# Patient Record
Sex: Female | Born: 1955 | Race: White | Hispanic: No | Marital: Single | Smoking: Current every day smoker
Health system: Southern US, Community
[De-identification: ages and names within clinical notes are randomized; demographics above are authoritative.]

---

## 2013-02-26 ENCOUNTER — Emergency Department (HOSPITAL_COMMUNITY): Payer: Medicaid Other

## 2013-02-26 ENCOUNTER — Inpatient Hospital Stay (HOSPITAL_COMMUNITY)
Admission: EM | Admit: 2013-02-26 | Discharge: 2013-03-05 | DRG: 064 | Disposition: E | Payer: Medicaid Other | Attending: Emergency Medicine | Admitting: Emergency Medicine

## 2013-02-26 ENCOUNTER — Encounter (HOSPITAL_COMMUNITY): Payer: Self-pay | Admitting: Emergency Medicine

## 2013-02-26 ENCOUNTER — Encounter (HOSPITAL_COMMUNITY)
Admission: AD | Admit: 2013-02-26 | Discharge: 2013-02-26 | Disposition: A | Discharge status: Home / Self Care | Payer: Medicaid Other | Source: Ambulatory Visit | Attending: Emergency Medicine | Admitting: Emergency Medicine

## 2013-02-26 ENCOUNTER — Other Ambulatory Visit: Payer: Self-pay

## 2013-02-26 DIAGNOSIS — J9600 Acute respiratory failure, unspecified whether with hypoxia or hypercapnia: Secondary | ICD-10-CM | POA: Diagnosis present

## 2013-02-26 DIAGNOSIS — Z66 Do not resuscitate: Secondary | ICD-10-CM | POA: Diagnosis present

## 2013-02-26 DIAGNOSIS — I10 Essential (primary) hypertension: Secondary | ICD-10-CM | POA: Diagnosis present

## 2013-02-26 DIAGNOSIS — I959 Hypotension, unspecified: Secondary | ICD-10-CM | POA: Diagnosis present

## 2013-02-26 DIAGNOSIS — G936 Cerebral edema: Secondary | ICD-10-CM | POA: Diagnosis present

## 2013-02-26 DIAGNOSIS — G9382 Brain death: Secondary | ICD-10-CM | POA: Diagnosis not present

## 2013-02-26 DIAGNOSIS — R4182 Altered mental status, unspecified: Secondary | ICD-10-CM | POA: Diagnosis present

## 2013-02-26 DIAGNOSIS — F17200 Nicotine dependence, unspecified, uncomplicated: Secondary | ICD-10-CM | POA: Diagnosis present

## 2013-02-26 DIAGNOSIS — R7309 Other abnormal glucose: Secondary | ICD-10-CM | POA: Diagnosis present

## 2013-02-26 DIAGNOSIS — J9690 Respiratory failure, unspecified, unspecified whether with hypoxia or hypercapnia: Secondary | ICD-10-CM

## 2013-02-26 DIAGNOSIS — G911 Obstructive hydrocephalus: Secondary | ICD-10-CM | POA: Diagnosis present

## 2013-02-26 DIAGNOSIS — D72829 Elevated white blood cell count, unspecified: Secondary | ICD-10-CM | POA: Diagnosis present

## 2013-02-26 DIAGNOSIS — I619 Nontraumatic intracerebral hemorrhage, unspecified: Secondary | ICD-10-CM | POA: Diagnosis present

## 2013-02-26 DIAGNOSIS — G9340 Encephalopathy, unspecified: Secondary | ICD-10-CM | POA: Diagnosis present

## 2013-02-26 DIAGNOSIS — Z823 Family history of stroke: Secondary | ICD-10-CM | POA: Diagnosis not present

## 2013-02-26 DIAGNOSIS — Z515 Encounter for palliative care: Secondary | ICD-10-CM

## 2013-02-26 DIAGNOSIS — R4020 Unspecified coma: Secondary | ICD-10-CM

## 2013-02-26 DIAGNOSIS — I629 Nontraumatic intracranial hemorrhage, unspecified: Secondary | ICD-10-CM

## 2013-02-26 DIAGNOSIS — Z529 Donor of unspecified organ or tissue: Secondary | ICD-10-CM | POA: Insufficient documentation

## 2013-02-26 LAB — CBC WITH DIFFERENTIAL/PLATELET
BASOS ABS: 0.2 10*3/uL — AB (ref 0.0–0.1)
Basophils Relative: 1 % (ref 0–1)
EOS ABS: 0.2 10*3/uL (ref 0.0–0.7)
Eosinophils Relative: 1 % (ref 0–5)
HEMATOCRIT: 46.2 % — AB (ref 36.0–46.0)
Hemoglobin: 16.1 g/dL — ABNORMAL HIGH (ref 12.0–15.0)
LYMPHS PCT: 31 % (ref 12–46)
Lymphs Abs: 6.4 10*3/uL — ABNORMAL HIGH (ref 0.7–4.0)
MCH: 30.9 pg (ref 26.0–34.0)
MCHC: 34.8 g/dL (ref 30.0–36.0)
MCV: 88.7 fL (ref 78.0–100.0)
Monocytes Absolute: 1.2 10*3/uL — ABNORMAL HIGH (ref 0.1–1.0)
Monocytes Relative: 6 % (ref 3–12)
Neutro Abs: 12.8 10*3/uL — ABNORMAL HIGH (ref 1.7–7.7)
Neutrophils Relative %: 61 % (ref 43–77)
PLATELETS: 325 10*3/uL (ref 150–400)
RBC: 5.21 MIL/uL — ABNORMAL HIGH (ref 3.87–5.11)
RDW: 12.4 % (ref 11.5–15.5)
WBC: 20.8 10*3/uL — AB (ref 4.0–10.5)

## 2013-02-26 LAB — URINALYSIS, ROUTINE W REFLEX MICROSCOPIC
Bilirubin Urine: NEGATIVE
Bilirubin Urine: NEGATIVE
GLUCOSE, UA: NEGATIVE mg/dL
Glucose, UA: 100 mg/dL — AB
Hgb urine dipstick: NEGATIVE
KETONES UR: NEGATIVE mg/dL
Ketones, ur: NEGATIVE mg/dL
LEUKOCYTES UA: NEGATIVE
Leukocytes, UA: NEGATIVE
NITRITE: NEGATIVE
Nitrite: NEGATIVE
PH: 6.5 (ref 5.0–8.0)
PH: 7.5 (ref 5.0–8.0)
Protein, ur: 100 mg/dL — AB
Protein, ur: NEGATIVE mg/dL
SPECIFIC GRAVITY, URINE: 1.025 (ref 1.005–1.030)
Specific Gravity, Urine: 1.009 (ref 1.005–1.030)
Urobilinogen, UA: 0.2 mg/dL (ref 0.0–1.0)
Urobilinogen, UA: 0.2 mg/dL (ref 0.0–1.0)

## 2013-02-26 LAB — COMPREHENSIVE METABOLIC PANEL
ALBUMIN: 3.5 g/dL (ref 3.5–5.2)
ALK PHOS: 95 U/L (ref 39–117)
ALT: 13 U/L (ref 0–35)
AST: 25 U/L (ref 0–37)
BUN: 10 mg/dL (ref 6–23)
CO2: 21 mEq/L (ref 19–32)
Calcium: 8.6 mg/dL (ref 8.4–10.5)
Chloride: 97 mEq/L (ref 96–112)
Creatinine, Ser: 0.87 mg/dL (ref 0.50–1.10)
GFR calc Af Amer: 84 mL/min — ABNORMAL LOW (ref >90)
GFR calc non Af Amer: 73 mL/min — ABNORMAL LOW (ref >90)
Glucose, Bld: 209 mg/dL — ABNORMAL HIGH (ref 70–99)
POTASSIUM: 4.6 meq/L (ref 3.7–5.3)
Sodium: 137 mEq/L (ref 137–147)
Total Bilirubin: <0.2 mg/dL — ABNORMAL LOW (ref 0.3–1.2)
Total Protein: 7.6 g/dL (ref 6.0–8.3)

## 2013-02-26 LAB — CG4 I-STAT (LACTIC ACID): LACTIC ACID, VENOUS: 5.78 mmol/L — AB (ref 0.5–2.2)

## 2013-02-26 LAB — PROTIME-INR
INR: 1 (ref 0.00–1.49)
Prothrombin Time: 13 seconds (ref 11.6–15.2)

## 2013-02-26 LAB — BASIC METABOLIC PANEL
BUN: 10 mg/dL (ref 6–23)
CHLORIDE: 100 meq/L (ref 96–112)
CO2: 26 meq/L (ref 19–32)
CREATININE: 0.97 mg/dL (ref 0.50–1.10)
Calcium: 8.9 mg/dL (ref 8.4–10.5)
GFR calc non Af Amer: 64 mL/min — ABNORMAL LOW (ref >90)
GFR, EST AFRICAN AMERICAN: 74 mL/min — AB (ref >90)
Glucose, Bld: 185 mg/dL — ABNORMAL HIGH (ref 70–99)
POTASSIUM: 3.7 meq/L (ref 3.7–5.3)
Sodium: 141 mEq/L (ref 137–147)

## 2013-02-26 LAB — URINE MICROSCOPIC-ADD ON

## 2013-02-26 LAB — POCT I-STAT 3, ART BLOOD GAS (G3+)
Acid-base deficit: 3 mmol/L — ABNORMAL HIGH (ref 0.0–2.0)
BICARBONATE: 24.4 meq/L — AB (ref 20.0–24.0)
O2 Saturation: 96 %
PCO2 ART: 53 mmHg — AB (ref 35.0–45.0)
PH ART: 7.271 — AB (ref 7.350–7.450)
Patient temperature: 98.6
TCO2: 26 mmol/L (ref 0–100)
pO2, Arterial: 95 mmHg (ref 80.0–100.0)

## 2013-02-26 LAB — GLUCOSE, CAPILLARY
Glucose-Capillary: 146 mg/dL — ABNORMAL HIGH (ref 70–99)
Glucose-Capillary: 164 mg/dL — ABNORMAL HIGH (ref 70–99)

## 2013-02-26 LAB — ABO/RH: ABO/RH(D): O POS

## 2013-02-26 LAB — RAPID URINE DRUG SCREEN, HOSP PERFORMED
Amphetamines: NOT DETECTED
BARBITURATES: NOT DETECTED
Benzodiazepines: NOT DETECTED
Cocaine: NOT DETECTED
Opiates: NOT DETECTED
Tetrahydrocannabinol: NOT DETECTED

## 2013-02-26 LAB — POCT I-STAT TROPONIN I: Troponin i, poc: 0.02 ng/mL (ref 0.00–0.08)

## 2013-02-26 LAB — PRO B NATRIURETIC PEPTIDE: PRO B NATRI PEPTIDE: 445.5 pg/mL — AB (ref 0–125)

## 2013-02-26 LAB — APTT: aPTT: 26 seconds (ref 24–37)

## 2013-02-26 LAB — ETHANOL: Alcohol, Ethyl (B): <11 mg/dL (ref 0–11)

## 2013-02-26 LAB — MRSA PCR SCREENING: MRSA BY PCR: NEGATIVE

## 2013-02-26 MED ORDER — SODIUM CHLORIDE 0.9 % IV SOLN
INTRAVENOUS | Status: DC
  Administered 2013-02-26: 14:00:00 via INTRAVENOUS

## 2013-02-26 MED ORDER — PROPOFOL 10 MG/ML IV EMUL: 5.0000 ug/kg/min | INTRAVENOUS | Status: DC

## 2013-02-26 MED ORDER — LIDOCAINE HCL (CARDIAC) 20 MG/ML IV SOLN
INTRAVENOUS | Status: AC
  Filled 2013-02-26: qty 5

## 2013-02-26 MED ORDER — ETOMIDATE 2 MG/ML IV SOLN
INTRAVENOUS | Status: AC
  Filled 2013-02-26: qty 20

## 2013-02-26 MED ORDER — PROPOFOL 10 MG/ML IV EMUL
5.0000 ug/kg/min | INTRAVENOUS | Status: DC
  Administered 2013-02-26: 10 ug/kg/min via INTRAVENOUS

## 2013-02-26 MED ORDER — PROPOFOL 10 MG/ML IV EMUL
INTRAVENOUS | Status: AC
  Administered 2013-02-26: 10 ug/kg/min via INTRAVENOUS
  Filled 2013-02-26: qty 100

## 2013-02-26 MED ORDER — PHENYLEPHRINE HCL 10 MG/ML IJ SOLN
30.0000 ug/min | INTRAVENOUS | Status: DC
  Administered 2013-02-26: 150 ug/min via INTRAVENOUS
  Administered 2013-02-27: 50 ug/min via INTRAVENOUS
  Filled 2013-02-26 (×2): qty 4

## 2013-02-26 MED ORDER — SUCCINYLCHOLINE CHLORIDE 20 MG/ML IJ SOLN
INTRAMUSCULAR | Status: AC
  Filled 2013-02-26: qty 1

## 2013-02-26 MED ORDER — NICARDIPINE HCL IN NACL 20-0.86 MG/200ML-% IV SOLN
3.0000 mg/h | INTRAVENOUS | Status: DC
  Administered 2013-02-26: 15 mg/h via INTRAVENOUS
  Filled 2013-02-26 (×2): qty 200

## 2013-02-26 MED ORDER — SODIUM CHLORIDE 0.9 % IV SOLN
INTRAVENOUS | Status: DC
  Administered 2013-02-26: 16:00:00 via INTRAVENOUS

## 2013-02-26 MED ORDER — SODIUM CHLORIDE 0.9 % IV BOLUS (SEPSIS)
2000.0000 mL | Freq: Once | INTRAVENOUS | Status: AC
  Administered 2013-02-26: 2000 mL via INTRAVENOUS

## 2013-02-26 MED ORDER — INSULIN ASPART 100 UNIT/ML ~~LOC~~ SOLN: 2.0000 [IU] | SUBCUTANEOUS | Status: DC

## 2013-02-26 MED ORDER — IOHEXOL 350 MG/ML SOLN
70.0000 mL | Freq: Once | INTRAVENOUS | Status: AC | PRN
  Administered 2013-02-26: 70 mL via INTRAVENOUS

## 2013-02-26 MED ORDER — SUCCINYLCHOLINE CHLORIDE 20 MG/ML IJ SOLN
INTRAMUSCULAR | Status: DC | PRN
  Administered 2013-02-26: 100 mg via INTRAVENOUS

## 2013-02-26 MED ORDER — HYDRALAZINE HCL 20 MG/ML IJ SOLN: 5.0000 mg | INTRAMUSCULAR | Status: DC | PRN

## 2013-02-26 MED ORDER — ETOMIDATE 2 MG/ML IV SOLN
INTRAVENOUS | Status: DC | PRN
  Administered 2013-02-26: 30 mg via INTRAVENOUS

## 2013-02-26 MED ORDER — PHENYLEPHRINE HCL 10 MG/ML IJ SOLN
30.0000 ug/min | INTRAVENOUS | Status: DC
  Administered 2013-02-26: 30 ug/min via INTRAVENOUS
  Filled 2013-02-26: qty 1

## 2013-02-26 MED ORDER — ROCURONIUM BROMIDE 50 MG/5ML IV SOLN
INTRAVENOUS | Status: AC
  Filled 2013-02-26: qty 2

## 2013-02-26 NOTE — ED Notes (Signed)
PCCM MD aware of continued elevated BP. Diprivan gtt started per order

## 2013-02-26 NOTE — Progress Notes (Signed)
3 separate RT's attempted art line insertion with no ability to thread cath.  No issues, or noted distress.

## 2013-02-26 NOTE — ED Notes (Signed)
PCCM MD at bedside. Diprivan gtt stopped per order

## 2013-02-26 NOTE — H&P (Signed)
Name: Emma FlockBetsi Pearson MRN: [id number] DOB: August 22, 1955    ADMISSION DATE:  02/24/2013 CONSULTATION DATE:  02/04/2013  REFERRING MD : Gwyneth SproutWhitney Plunkett, MD PRIMARY SERVICE: [facility]  CHIEF COMPLAINT:  Altered mental Status, Fall  BRIEF PATIENT DESCRIPTION: 58 Y O Female, No Hx per our Records and none from family, presented with Altered mental status and unresponsiveness. Was manually ventilated by EMS, Was Intubated in the ED.   SIGNIFICANT EVENTS / STUDIES:  - ETT- 02/23/2013 >>>   LINES / TUBES: Foley- 02/11/2013  CULTURES:   ANTIBIOTICS: None  HISTORY OF PRESENT ILLNESS: 58 Y O F with no PMH, as patient does not believe in going to doctors. Patient was in her usual state of health until this morning when she was doing her normal chores outside when she suddenly called out for her mum, telling her to call the police, and that "her body just went numb". By the time her mum came to see her she was on the floor, unresponsive. No jerking of her extremities, urinary or fecal incontinence, no facial assym. No prior compliants of headache, change in vision , chest pain or palpitations, no difficulty breathing, Family reports good appetite, no vomiting or diarrhea, no fever. Unknown if pt has any chronic medical conditions. Patient smokes 1 pack of cigarette/day and has for the past ~20 years. Does not take alcohol or take any medication, except the occasional tylenol.  PAST MEDICAL HISTORY :  History reviewed. No pertinent past medical history. History reviewed. No pertinent past surgical history. Prior to Admission medications   Not on File   No Known Allergies  FAMILY HISTORY:  Patients brother died of a hemorrhagic stroke about a year ago.  SOCIAL HISTORY:  reports that she has been smoking.  She does not have any smokeless tobacco history on file. She reports that she does not drink alcohol or use illicit drugs.  REVIEW OF SYSTEMS:  As per HPI.  SUBJECTIVE:   VITAL  SIGNS: Temp:  [98 F (36.7 C)-98.4 F (36.9 C)] 98.4 F (36.9 C) (01/25 1404) Pulse Rate:  [64-115] 66 (01/25 1430) Resp:  [16-18] 16 (01/25 1430) BP: (145-225)/(71-95) 187/90 mmHg (01/25 1430) SpO2:  [97 %-100 %] 100 % (01/25 1430) FiO2 (%):  [40 %] 40 % (01/25 1430) Weight:  [185 lb (83.915 kg)] 185 lb (83.915 kg) (01/25 1303) HEMODYNAMICS:   VENTILATOR SETTINGS: Vent Mode:  [-] PRVC FiO2 (%):  [40 %] 40 % Set Rate:  [16 bmp] 16 bmp Vt Set:  [500 mL] 500 mL PEEP:  [5 cmH20] 5 cmH20 Plateau Pressure:  [19 cmH20] 19 cmH20 INTAKE / OUTPUT: Intake/Output   None     PHYSICAL EXAMINATION: General:  Unresponsive, had gotten some paralytics for intubation, off sedatives, OTT in place Neuro:  Unresponsive. HEENT:  Pupils- dilated equally, not responsive to light bilat. Cardiovascular:  S1, S2, no murmurs, rubs or gallops. Lungs:  Coarse Breath sounds bilat, on Vent, equal vols of air bilat. Abdomen: Soft, bowel sounds reduced. Musculoskeletal:  No pedal edema. Skin: Warm , dry, No rash.  LABS:  CBC  Recent Labs Lab 02/25/2013 1240  WBC 20.8*  HGB 16.1*  HCT 46.2*  PLT 325   Coag's  Recent Labs Lab 02/06/2013 1240  APTT 26  INR 1.00   BMET  Recent Labs Lab 02/25/2013 1240  NA 137  K 4.6  CL 97  CO2 21  BUN 10  CREATININE 0.87  GLUCOSE 209*   Electrolytes  Recent Labs Lab  02/28/2013 1240  CALCIUM 8.6   Sepsis Markers  Recent Labs Lab 02/02/2013 1309  LATICACIDVEN 5.78*   ABG  Recent Labs Lab 02/13/2013 1300  PHART 7.271*  PCO2ART 53.0*  PO2ART 95.0   Liver Enzymes  Recent Labs Lab 02/07/2013 1240  AST 25  ALT 13  ALKPHOS 95  BILITOT <0.2*  ALBUMIN 3.5   Cardiac Enzymes  Recent Labs Lab 02/02/2013 1240  PROBNP 445.5*   Glucose No results found for this basename: GLUCAP,  in the last 168 hours  Imaging Dg Chest Port 1 View  02/19/2013   CLINICAL DATA:  Acute respiratory failure.  Intubation.  EXAM: PORTABLE CHEST - 1 VIEW   COMPARISON:  None.  FINDINGS: Endotracheal tube is seen in appropriate position. Nasogastric tube is seen with the tip in the mid stomach. The heart size and mediastinal contours are within normal limits. Both lungs are clear.  IMPRESSION: Endotracheal tube and nasogastric tube in appropriate position. No active lung disease.   Electronically Signed   By: Myles Rosenthal M.D.   On: 02/13/2013 13:26    CXR: ETT and NG tube in place, No active lung dx.  ASSESSMENT / PLAN:  PULMONARY A: Acute respiratory failure 2/2   ?PE vs primary neuro event. No evidence at this time for primary cardiac event P:   - MV, vent bundle - CT angio pending - Chest Xray in the Am. - ABG 6pm today  CARDIOVASCULAR A: HTN  P:  - Trend Trops - Lactic acid tomorrow. - EKG repeat. - Add IV BP control if she remains hypertensive post-sedation.   RENAL A:  No acute issues P:   Monitor  GASTROINTESTINAL A:  None  P:   - Hold Tube feeds for now.  HEMATOLOGIC A:  Leukocyctosis (Likely stress reaction Vs Infection).  P:  - Trend CBC - SCDs   INFECTIOUS A:  Possible infection, Considering leukocytosis, ?Focus, likely stress reaction. P:   - Follow urinalysis- With cultures if indicated. - Blood cultures. - defer abx for now  ENDOCRINE A:   Hyperglycemia  P:   - HBA1c Check - Hyperglycemia Protocol.  NEUROLOGIC A:  Acute altered Mental status-  R midbrain and brainstem ICH   P:   - Consult Neurosurg regarding any appropriate intervention - Add back sedation - Allow permissive hypertension > goal 180's SBP - Pt- INR - Avoid Anticoag or antiplatelets.  TODAY'S SUMMARY: Presented with Altered mental status, resp collapse and persistent unresponsiveness. No known PMH. CT Head findings of  Right intracerebral hemorrhage. Neurosurgery consulted.  60 minutes CC time.   Inocente Salles, MD PGY-1. 02/25/2013, 3:11 PM  Levy Pupa, MD, PhD 02/22/2013, 3:24 PM [facility] [phone] or if no answer [phone]

## 2013-02-26 NOTE — ED Notes (Signed)
Returned from CT scan.

## 2013-02-26 NOTE — ED Notes (Signed)
Dr. Anitra LauthPlunkett at bedside preparing to intubate patient.

## 2013-02-26 NOTE — ED Notes (Signed)
Patient transported to CT 

## 2013-02-26 NOTE — Progress Notes (Signed)
Chaplain offered emotional support to pt's mother and her friend/neighbor.  Pt's mother mentioned; that a son had died two and 1/2 years ago and that pt is the youngest of 7 children.  Chaplain listened empathically, engaged in compassionate and caring conversation and obtained sprite from nourishment center.  Chaplain escorted pt's mother and friend from trauma c to consultation A, visited a while longer there.  Chaplain will follow up as needed.  Pt's mother and her friend expressed appreciation for chaplain's support.   02/12/2013 1400  Clinical Encounter Type  Visited With Patient and family together  Visit Type Spiritual support;ED  Stress Factors  Family Stress Factors Lack of knowledge    Rulon Abideavid B Sherrod, chaplain pager [phone]

## 2013-02-26 NOTE — Progress Notes (Signed)
Patient came in with EMS unresponsive was manually ventilated with Bag Valve Mask, ED physician intubated with a 7.5 ETT taped at 23 at lips good color change on End tidal CO2 detector, good BBS ausculted over lung fields, SATS 100% on current vent settings, HR 67, RR 16, BP 165/87. Will continue to monitor patient.

## 2013-02-26 NOTE — Progress Notes (Signed)
Chaplain followed up with pt and pt's mother and mother's friend. Chaplain gave emotional support by staying with pt's mother and friend after they received the status report on pt.  Chaplain escorted pt's mother to 26M waiting area where he continued to give support through compassionate conversation and empathic listening. Chaplain will follow up as needed.   04/13/13 1600  Clinical Encounter Type  Visited With Family;Patient and family together  Visit Type Spiritual support  Spiritual Encounters  Spiritual Needs Emotional  Stress Factors  Family Stress Factors Major life changes;Loss    Rulon Abideavid B Sherrod, chaplain pager [phone]

## 2013-02-26 NOTE — ED Notes (Addendum)
Pt presents to department via [facility] from home. Mother states patient began "shaking" this morning and collapsed to ground. Upon EMS arrival pt unresponsive, snoring respirations, pinpoint pupils. EMS attempted to establish advanced airway, but unable. CBG 103.

## 2013-02-26 NOTE — ED Notes (Signed)
RSI successful. 7.5 ET tube, 20cm at lip. Bilateral lung sounds, positive color change.

## 2013-02-26 NOTE — Procedures (Signed)
Arterial Catheter Insertion Procedure Note Emma Pearson [id number] 05-19-1955  Procedure: Insertion of Arterial Catheter  Indications: Blood pressure monitoring  Procedure Details Consent: Unable to obtain consent because of altered level of consciousness. Time Out: Verified patient identification, verified procedure, site/side was marked, verified correct patient position, special equipment/implants available, medications/allergies/relevent history reviewed, required imaging and test results available.  Performed  Maximum sterile technique was used including antiseptics, cap, gloves, gown, hand hygiene, mask and sheet. Skin prep: Chlorhexidine; local anesthetic administered 22 gauge catheter was inserted into right radial artery using the Seldinger technique under direct visualization with ultrasound.  Evaluation Blood flow good; BP tracing good. Complications: No apparent complications.   Emma Pearson, Emma Pearson 2013/08/08

## 2013-02-26 NOTE — Progress Notes (Signed)
eLink Physician-Brief Progress Note Patient Name: Emma FlockBetsi Ahlquist DOB: Aug 15, 1955 MRN: [id number]  Date of Service  02/12/2013   HPI/Events of Note   Case discussed with Dr. Delton CoombesByrum.  Large brainstem bleed, devastating neurologic exam.  He has discussed the case with the patient's mother and neurosurgery.  There are no life saving options available at this point.  Our care will be focused on palliation.  eICU Interventions  DNR orders written Advised bedside nurses Will need to discuss timing of inevitable one way extubation with family.      MCQUAID, DOUGLAS 02/28/2013, 4:15 PM

## 2013-02-26 NOTE — ED Notes (Signed)
PCXR complete.

## 2013-02-26 NOTE — ED Provider Notes (Addendum)
CSN: [id number]     Arrival date & time 02/22/2013  1214 History   First MD Initiated Contact with Patient 02/09/2013 1226     Chief Complaint  Patient presents with  . Altered Mental Status  . Fall   (Consider location/radiation/quality/duration/timing/severity/associated sxs/prior Treatment) HPI Comments: Upon arrival of [facility] patient is unresponsive with vomitus around her mouth and diagonally breathing.   Patient is a 58 y.o. female presenting with altered mental status and fall. The history is provided by the EMS personnel and a relative. The history is limited by the condition of the patient and the absence of a caregiver.  Altered Mental Status Presenting symptoms: unresponsiveness   Severity:  Severe Most recent episode:  Today Episode history:  Single Timing:  Constant Progression:  Unchanged Chronicity:  New Context comment:  Patient's mother states she was working in the yard like she normally does and ran into the door yelling help called the police and then the patient collapsed Associated symptoms comment:  Mom reports that patient smokes heavily and did not go to the doctor but has not had any complaints and has not been sick recently Fall    History reviewed. No pertinent past medical history. History reviewed. No pertinent past surgical history. No family history on file. History  Substance Use Topics  . Smoking status: Current Every Day Smoker  . Smokeless tobacco: Not on file  . Alcohol Use: No   OB History   Grav Para Term Preterm Abortions TAB SAB Ect Mult Living                 Review of Systems  Unable to perform ROS   Allergies  Review of patient's allergies indicates no known allergies.  Home Medications  No current outpatient prescriptions on file. BP 165/87  Pulse 69  Resp 16  Ht 5\' 6"  (1.676 m)  Wt 185 lb (83.915 kg)  BMI 29.87 kg/m2  SpO2 99% Physical Exam  Nursing note and vitals reviewed. Constitutional: She appears well-developed  and well-nourished.  HENT:  Head: Normocephalic and atraumatic.  Vomitus diffusely around the mouth  Eyes: Conjunctivae and EOM are normal.  4mm pupils that are sluggishly reactive  Neck: Normal range of motion. Neck supple.  Cardiovascular: Regular rhythm and intact distal pulses.  Tachycardia present.   No murmur heard. Pulmonary/Chest: Effort normal and breath sounds normal. No respiratory distress. She has no wheezes. She has no rales.  Abdominal: Soft. She exhibits no distension. There is no tenderness. There is no rebound and no guarding.  Musculoskeletal: Normal range of motion. She exhibits no edema and no tenderness.  Neurological: She is unresponsive.  Pt not responsive to pain but spontaneous breathing.  Did not see movement of any extremities  Skin: Skin is warm and dry. No rash noted. No erythema.  Psychiatric: She has a normal mood and affect. Her behavior is normal.    ED Course  Procedures (including critical care time) Labs Review Labs Reviewed  CBC WITH DIFFERENTIAL - Abnormal; Notable for the following:    WBC 20.8 (*)    RBC 5.21 (*)    Hemoglobin 16.1 (*)    HCT 46.2 (*)    Neutro Abs 12.8 (*)    Lymphs Abs 6.4 (*)    Monocytes Absolute 1.2 (*)    Basophils Absolute 0.2 (*)    All other components within normal limits  PRO B NATRIURETIC PEPTIDE - Abnormal; Notable for the following:    Pro B Natriuretic  peptide (BNP) 445.5 (*)    All other components within normal limits  POCT I-STAT 3, BLOOD GAS (G3+) - Abnormal; Notable for the following:    pH, Arterial 7.271 (*)    pCO2 arterial 53.0 (*)    Bicarbonate 24.4 (*)    Acid-base deficit 3.0 (*)    All other components within normal limits  CG4 I-STAT (LACTIC ACID) - Abnormal; Notable for the following:    Lactic Acid, Venous 5.78 (*)    All other components within normal limits  PROTIME-INR  APTT  COMPREHENSIVE METABOLIC PANEL  URINALYSIS, ROUTINE W REFLEX MICROSCOPIC  ETHANOL  URINE RAPID DRUG  SCREEN (HOSP PERFORMED)  POCT I-STAT TROPONIN I   Imaging Review Dg Chest Port 1 View  02/05/2013   CLINICAL DATA:  Acute respiratory failure.  Intubation.  EXAM: PORTABLE CHEST - 1 VIEW  COMPARISON:  None.  FINDINGS: Endotracheal tube is seen in appropriate position. Nasogastric tube is seen with the tip in the mid stomach. The heart size and mediastinal contours are within normal limits. Both lungs are clear.  IMPRESSION: Endotracheal tube and nasogastric tube in appropriate position. No active lung disease.   Electronically Signed   By: Myles Rosenthal M.D.   On: 02/15/2013 13:26    EKG Interpretation   None       Date: 02/26/2013  Rate: 121  Rhythm: sinus tachycardia  QRS Axis: right  Intervals: normal  ST/T Wave abnormalities: nonspecific ST/T changes  Conduction Disutrbances:lvh  Narrative Interpretation:   Old EKG Reviewed: none available  INTUBATION Performed by: Donaldson Richter  Required items: required blood products, implants, devices, and special equipment available Patient identity confirmed: provided demographic data and hospital-assigned identification number Time out: Immediately prior to procedure a "time out" was called to verify the correct patient, procedure, equipment, support staff and site/side marked as required.  Indications: unresponsive not protecting airway  Intubation method: Glidescope Laryngoscopy   Preoxygenation: BVM  Sedatives: 30Etomidate Paralytic: 100Succinylcholine  Tube Size: 7.5 cuffed  Post-procedure assessment: chest rise and ETCO2 monitor Breath sounds: equal and absent over the epigastrium Tube secured with: ETT holder Chest x-ray interpreted by radiologist and me.  Chest x-ray findings: endotracheal tube in appropriate position  Patient tolerated the procedure well with no immediate complications.     MDM   1. Loss of consciousness   2. Respiratory failure   3. Hypertension     Patient presented by EMS after a  witnessed collapse by her mother. Patient has had no mental status other than breathing spontaneously in a gag reflex since EMS arrived and upon arrival to the hospital. Upon arrival here patient was not protecting her airway and was requiring bag valve mask to maintain her oxygen saturations and she was intubated. There were no complications with intubation as above.  Lung sounds are clear there is no sign of pneumothorax. Tachycardia route and hypoxia resolved after intubation. Blood pressure improved after intubation as well. Patient is vascularly intact at 2+ pulses in all 4 extremities without signs of fluid overload. Patient's pupils are sluggishly reactive in no witnessed movement of the arms or the legs.  Concern from PE versus cardiac cause for the collapse versus for stroke versus brain bleed versus respiratory collapse.  Patient on ABG shows respiratory acidosis with a CO2 of 53 and pH 7.27.  Lactic acid elevated at 5. Chest x-ray showed good tube position without overt signs of abnormalities. CBC with leukocytosis of 20,000 normal white blood cell count  Head CT,  CMP pending. I spoke with critical care who will assume care of the patient. She was initially started on propofol said that frequent neuro exams can be done  CRITICAL CARE Performed by: Gwyneth Sprout Total critical care time: 45 Critical care time was exclusive of separately billable procedures and treating other patients. Critical care was necessary to treat or prevent imminent or life-threatening deterioration. Critical care was time spent personally by me on the following activities: development of treatment plan with patient and/or surrogate as well as nursing, discussions with consultants, evaluation of patient's response to treatment, examination of patient, obtaining history from patient or surrogate, ordering and performing treatments and interventions, ordering and review of laboratory studies, ordering and review of  radiographic studies, pulse oximetry and re-evaluation of patient's condition.   Gwyneth Sprout, MD 2013/03/26 1344  Gwyneth Sprout, MD 03/26/13 1346

## 2013-02-27 ENCOUNTER — Inpatient Hospital Stay (HOSPITAL_COMMUNITY): Payer: Medicaid Other

## 2013-02-27 DIAGNOSIS — I519 Heart disease, unspecified: Secondary | ICD-10-CM

## 2013-02-27 DIAGNOSIS — J9600 Acute respiratory failure, unspecified whether with hypoxia or hypercapnia: Secondary | ICD-10-CM

## 2013-02-27 DIAGNOSIS — Z515 Encounter for palliative care: Secondary | ICD-10-CM

## 2013-02-27 DIAGNOSIS — I629 Nontraumatic intracranial hemorrhage, unspecified: Secondary | ICD-10-CM

## 2013-02-27 LAB — POCT I-STAT 3, ART BLOOD GAS (G3+)
ACID-BASE DEFICIT: 2 mmol/L (ref 0.0–2.0)
Acid-Base Excess: 1 mmol/L (ref 0.0–2.0)
Acid-Base Excess: 3 mmol/L — ABNORMAL HIGH (ref 0.0–2.0)
Acid-Base Excess: 4 mmol/L — ABNORMAL HIGH (ref 0.0–2.0)
Acid-Base Excess: 5 mmol/L — ABNORMAL HIGH (ref 0.0–2.0)
BICARBONATE: 25.2 meq/L — AB (ref 20.0–24.0)
BICARBONATE: 26.7 meq/L — AB (ref 20.0–24.0)
BICARBONATE: 27.2 meq/L — AB (ref 20.0–24.0)
BICARBONATE: 31.1 meq/L — AB (ref 20.0–24.0)
BICARBONATE: 32.3 meq/L — AB (ref 20.0–24.0)
Bicarbonate: 23.7 mEq/L (ref 20.0–24.0)
Bicarbonate: 23 mEq/L (ref 20.0–24.0)
O2 SAT: 95 %
O2 SAT: 100 %
O2 Saturation: 100 %
O2 Saturation: 100 %
O2 Saturation: 100 %
O2 Saturation: 100 %
O2 Saturation: 99 %
PCO2 ART: 53.1 mmHg — AB (ref 35.0–45.0)
PCO2 ART: 40.9 mmHg (ref 35.0–45.0)
PH ART: 7.293 — AB (ref 7.350–7.450)
PH ART: 7.375 (ref 7.350–7.450)
PO2 ART: 216 mmHg — AB (ref 80.0–100.0)
PO2 ART: 315 mmHg — AB (ref 80.0–100.0)
PO2 ART: 316 mmHg — AB (ref 80.0–100.0)
PO2 ART: 70 mmHg — AB (ref 80.0–100.0)
Patient temperature: 35.8
Patient temperature: 36.2
Patient temperature: 36.8
Patient temperature: 97.7
Patient temperature: 97.8
TCO2: 24 mmol/L (ref 0–100)
TCO2: 25 mmol/L (ref 0–100)
TCO2: 26 mmol/L (ref 0–100)
TCO2: 28 mmol/L (ref 0–100)
TCO2: 28 mmol/L (ref 0–100)
TCO2: 33 mmol/L (ref 0–100)
TCO2: 34 mmol/L (ref 0–100)
pCO2 arterial: 30.8 mmHg — ABNORMAL LOW (ref 35.0–45.0)
pCO2 arterial: 35.2 mmHg (ref 35.0–45.0)
pCO2 arterial: 35.9 mmHg (ref 35.0–45.0)
pCO2 arterial: 47.1 mmHg — ABNORMAL HIGH (ref 35.0–45.0)
pCO2 arterial: 66.5 mmHg (ref 35.0–45.0)
pH, Arterial: 7.359 (ref 7.350–7.450)
pH, Arterial: 7.395 (ref 7.350–7.450)
pH, Arterial: 7.409 (ref 7.350–7.450)
pH, Arterial: 7.432 (ref 7.350–7.450)
pH, Arterial: 7.554 — ABNORMAL HIGH (ref 7.350–7.450)
pO2, Arterial: 127 mmHg — ABNORMAL HIGH (ref 80.0–100.0)
pO2, Arterial: 161 mmHg — ABNORMAL HIGH (ref 80.0–100.0)
pO2, Arterial: 289 mmHg — ABNORMAL HIGH (ref 80.0–100.0)

## 2013-02-27 LAB — HEMOGLOBIN A1C
Hgb A1c MFr Bld: 5.9 % — ABNORMAL HIGH (ref <5.7)
Mean Plasma Glucose: 123 mg/dL — ABNORMAL HIGH (ref <117)

## 2013-02-27 LAB — CBC
HCT: 38.8 % (ref 36.0–46.0)
HEMOGLOBIN: 14 g/dL (ref 12.0–15.0)
MCH: 31.5 pg (ref 26.0–34.0)
MCHC: 36.1 g/dL — AB (ref 30.0–36.0)
MCV: 87.2 fL (ref 78.0–100.0)
Platelets: 294 10*3/uL (ref 150–400)
RBC: 4.45 MIL/uL (ref 3.87–5.11)
RDW: 12.9 % (ref 11.5–15.5)
WBC: 22.3 10*3/uL — ABNORMAL HIGH (ref 4.0–10.5)

## 2013-02-27 LAB — COMPREHENSIVE METABOLIC PANEL
ALBUMIN: 2.9 g/dL — AB (ref 3.5–5.2)
ALT: 9 U/L (ref 0–35)
AST: 16 U/L (ref 0–37)
Alkaline Phosphatase: 91 U/L (ref 39–117)
BILIRUBIN TOTAL: 0.6 mg/dL (ref 0.3–1.2)
BUN: 16 mg/dL (ref 6–23)
CO2: 23 mEq/L (ref 19–32)
CREATININE: 1.21 mg/dL — AB (ref 0.50–1.10)
Calcium: 8.6 mg/dL (ref 8.4–10.5)
Chloride: 107 mEq/L (ref 96–112)
GFR calc Af Amer: 56 mL/min — ABNORMAL LOW (ref >90)
GFR calc non Af Amer: 49 mL/min — ABNORMAL LOW (ref >90)
Glucose, Bld: 116 mg/dL — ABNORMAL HIGH (ref 70–99)
POTASSIUM: 3.1 meq/L — AB (ref 3.7–5.3)
Sodium: 143 mEq/L (ref 137–147)
TOTAL PROTEIN: 6.4 g/dL (ref 6.0–8.3)

## 2013-02-27 LAB — URINE CULTURE
CULTURE: NO GROWTH
Colony Count: NO GROWTH

## 2013-02-27 LAB — GRAM STAIN

## 2013-02-27 LAB — DIFFERENTIAL
BASOS ABS: 0.1 10*3/uL (ref 0.0–0.1)
BASOS PCT: 0 % (ref 0–1)
EOS ABS: 0.1 10*3/uL (ref 0.0–0.7)
EOS PCT: 0 % (ref 0–5)
Lymphocytes Relative: 18 % (ref 12–46)
Lymphs Abs: 4.1 10*3/uL — ABNORMAL HIGH (ref 0.7–4.0)
Monocytes Absolute: 1.7 10*3/uL — ABNORMAL HIGH (ref 0.1–1.0)
Monocytes Relative: 8 % (ref 3–12)
Neutro Abs: 16.4 10*3/uL — ABNORMAL HIGH (ref 1.7–7.7)
Neutrophils Relative %: 74 % (ref 43–77)

## 2013-02-27 LAB — FIBRINOGEN: FIBRINOGEN: 559 mg/dL — AB (ref 204–475)

## 2013-02-27 LAB — CK TOTAL AND CKMB (NOT AT ARMC)
CK, MB: 4.4 ng/mL — AB (ref 0.3–4.0)
Relative Index: 1.7 (ref 0.0–2.5)
Total CK: 259 U/L — ABNORMAL HIGH (ref 7–177)

## 2013-02-27 LAB — BASIC METABOLIC PANEL
BUN: 16 mg/dL (ref 6–23)
CHLORIDE: 110 meq/L (ref 96–112)
CO2: 23 meq/L (ref 19–32)
CREATININE: 1.18 mg/dL — AB (ref 0.50–1.10)
Calcium: 8.6 mg/dL (ref 8.4–10.5)
GFR calc Af Amer: 58 mL/min — ABNORMAL LOW (ref >90)
GFR calc non Af Amer: 50 mL/min — ABNORMAL LOW (ref >90)
GLUCOSE: 148 mg/dL — AB (ref 70–99)
Potassium: 2.9 mEq/L — CL (ref 3.7–5.3)
Sodium: 146 mEq/L (ref 137–147)

## 2013-02-27 LAB — PREPARE RBC (CROSSMATCH)

## 2013-02-27 LAB — GLUCOSE, CAPILLARY
GLUCOSE-CAPILLARY: 104 mg/dL — AB (ref 70–99)
Glucose-Capillary: 137 mg/dL — ABNORMAL HIGH (ref 70–99)
Glucose-Capillary: 276 mg/dL — ABNORMAL HIGH (ref 70–99)
Glucose-Capillary: 161 mg/dL — ABNORMAL HIGH (ref 70–99)

## 2013-02-27 LAB — LACTIC ACID, PLASMA: Lactic Acid, Venous: 1.1 mmol/L (ref 0.5–2.2)

## 2013-02-27 LAB — ABO/RH: ABO/RH(D): O POS

## 2013-02-27 LAB — MAGNESIUM
Magnesium: 1.7 mg/dL (ref 1.5–2.5)
Magnesium: 1.7 mg/dL (ref 1.5–2.5)

## 2013-02-27 LAB — PROTIME-INR
INR: 1.09 (ref 0.00–1.49)
Prothrombin Time: 13.9 seconds (ref 11.6–15.2)

## 2013-02-27 LAB — PHOSPHORUS
PHOSPHORUS: 2.7 mg/dL (ref 2.3–4.6)
Phosphorus: 1.9 mg/dL — ABNORMAL LOW (ref 2.3–4.6)

## 2013-02-27 LAB — LACTATE DEHYDROGENASE: LDH: 225 U/L (ref 94–250)

## 2013-02-27 LAB — BILIRUBIN, DIRECT: Bilirubin, Direct: <0.2 mg/dL (ref 0.0–0.3)

## 2013-02-27 LAB — AMYLASE: AMYLASE: 45 U/L (ref 0–105)

## 2013-02-27 LAB — GAMMA GT: GGT: 12 U/L (ref 7–51)

## 2013-02-27 LAB — TROPONIN I: TROPONIN I: 0.88 ng/mL — AB (ref <0.30)

## 2013-02-27 LAB — APTT: aPTT: 31 seconds (ref 24–37)

## 2013-02-27 LAB — LIPASE, BLOOD: LIPASE: 22 U/L (ref 11–59)

## 2013-02-27 MED ORDER — HYDRALAZINE HCL 20 MG/ML IJ SOLN
20.0000 mg | Freq: Once | INTRAMUSCULAR | Status: AC
  Administered 2013-02-27: 20 mg via INTRAVENOUS

## 2013-02-27 MED ORDER — VANCOMYCIN HCL 10 G IV SOLR
1500.0000 mg | INTRAVENOUS | Status: AC
  Filled 2013-02-27: qty 1500

## 2013-02-27 MED ORDER — LABETALOL HCL 5 MG/ML IV SOLN
5.0000 mg | Freq: Once | INTRAVENOUS | Status: AC
  Administered 2013-02-27: 5 mg via INTRAVENOUS

## 2013-02-27 MED ORDER — SODIUM CHLORIDE 0.9 % IV SOLN
2000.0000 mg | Freq: Once | INTRAVENOUS | Status: AC
  Administered 2013-02-27: 2000 mg via INTRAVENOUS
  Filled 2013-02-27: qty 16

## 2013-02-27 MED ORDER — LABETALOL HCL 5 MG/ML IV SOLN
INTRAVENOUS | Status: AC
  Filled 2013-02-27: qty 4

## 2013-02-27 MED ORDER — LEVOTHYROXINE SODIUM 100 MCG IV SOLR
20.0000 ug | Freq: Once | INTRAVENOUS | Status: AC
  Administered 2013-02-27: 20 ug via INTRAVENOUS
  Filled 2013-02-27: qty 5

## 2013-02-27 MED ORDER — DEXTROSE 50 % IV SOLN
50.0000 mL | Freq: Once | INTRAVENOUS | Status: AC
  Administered 2013-02-27: 50 mL via INTRAVENOUS
  Filled 2013-02-27: qty 50

## 2013-02-27 MED ORDER — SODIUM CHLORIDE 0.9 % IV BOLUS (SEPSIS)
500.0000 mL | Freq: Once | INTRAVENOUS | Status: AC
  Administered 2013-02-27: 500 mL via INTRAVENOUS

## 2013-02-27 MED ORDER — POTASSIUM CHLORIDE IN NACL 20-0.9 MEQ/L-% IV SOLN
INTRAVENOUS | Status: DC
  Administered 2013-02-27 – 2013-02-28 (×2): via INTRAVENOUS
  Filled 2013-02-27 (×28): qty 1000

## 2013-02-27 MED ORDER — FLUCONAZOLE IN SODIUM CHLORIDE 400-0.9 MG/200ML-% IV SOLN
400.0000 mg | INTRAVENOUS | Status: AC
  Administered 2013-02-27: 400 mg via INTRAVENOUS
  Filled 2013-02-27: qty 200

## 2013-02-27 MED ORDER — NALOXONE HCL 1 MG/ML IJ SOLN
8.0000 mg | INTRAMUSCULAR | Status: DC | PRN
  Administered 2013-02-27: 8 mg via INTRAVENOUS
  Filled 2013-02-27 (×2): qty 8

## 2013-02-27 MED ORDER — ALBUTEROL SULFATE (5 MG/ML) 0.5% IN NEBU
2.5000 mg | INHALATION_SOLUTION | RESPIRATORY_TRACT | Status: DC | PRN
  Filled 2013-02-27: qty 3
  Filled 2013-02-27: qty 0.5

## 2013-02-27 MED ORDER — SODIUM CHLORIDE 0.9 % IV SOLN
INTRAVENOUS | Status: DC
  Administered 2013-02-27 (×2): via INTRAVENOUS

## 2013-02-27 MED ORDER — POTASSIUM PHOSPHATE DIBASIC 3 MMOLE/ML IV SOLN
20.0000 meq | Freq: Once | INTRAVENOUS | Status: AC
  Administered 2013-02-27: 20 meq via INTRAVENOUS
  Filled 2013-02-27: qty 4.55

## 2013-02-27 MED ORDER — IPRATROPIUM BROMIDE 0.02 % IN SOLN
0.5000 mg | RESPIRATORY_TRACT | Status: DC | PRN
  Administered 2013-02-27: 0.5 mg via RESPIRATORY_TRACT
  Filled 2013-02-27: qty 2.5

## 2013-02-27 MED ORDER — SODIUM CHLORIDE 0.9 % IV BOLUS (SEPSIS)
1000.0000 mL | Freq: Once | INTRAVENOUS | Status: AC
  Administered 2013-02-27: 1000 mL via INTRAVENOUS

## 2013-02-27 MED ORDER — PHENYLEPHRINE HCL 10 MG/ML IJ SOLN
30.0000 ug/min | INTRAVENOUS | Status: DC
  Filled 2013-02-27: qty 1

## 2013-02-27 MED ORDER — NITROPRUSSIDE SODIUM 25 MG/ML IV SOLN
0.2500 ug/kg/min | INTRAVENOUS | Status: DC
  Administered 2013-02-27: 0.5 ug/kg/min via INTRAVENOUS
  Filled 2013-02-27: qty 2

## 2013-02-27 MED ORDER — LABETALOL HCL 5 MG/ML IV SOLN
10.0000 mg | Freq: Once | INTRAVENOUS | Status: AC
  Administered 2013-02-27: 10 mg via INTRAVENOUS

## 2013-02-27 MED ORDER — MAGNESIUM SULFATE 40 MG/ML IJ SOLN
2.0000 g | Freq: Once | INTRAMUSCULAR | Status: AC
  Administered 2013-02-27: 2 g via INTRAVENOUS
  Filled 2013-02-27: qty 50

## 2013-02-27 MED ORDER — CIPROFLOXACIN IN D5W 400 MG/200ML IV SOLN
400.0000 mg | INTRAVENOUS | Status: AC
  Administered 2013-02-27: 400 mg via INTRAVENOUS
  Filled 2013-02-27: qty 200

## 2013-02-27 MED ORDER — LEVOTHYROXINE SODIUM 100 MCG IV SOLR
10.0000 ug/h | INTRAVENOUS | Status: DC
  Administered 2013-02-27: 10 ug/h via INTRAVENOUS
  Filled 2013-02-27: qty 10

## 2013-02-27 MED ORDER — INSULIN ASPART 100 UNIT/ML ~~LOC~~ SOLN
20.0000 [IU] | Freq: Once | SUBCUTANEOUS | Status: AC
  Administered 2013-02-27: 20 [IU] via SUBCUTANEOUS

## 2013-02-28 ENCOUNTER — Encounter (HOSPITAL_COMMUNITY): Admission: EM | Disposition: E | Payer: Self-pay | Source: Home / Self Care | Attending: Emergency Medicine

## 2013-02-28 ENCOUNTER — Inpatient Hospital Stay (HOSPITAL_COMMUNITY): Payer: Medicaid Other

## 2013-02-28 ENCOUNTER — Inpatient Hospital Stay (HOSPITAL_COMMUNITY): Payer: Medicaid Other | Admitting: Anesthesiology

## 2013-02-28 ENCOUNTER — Encounter (HOSPITAL_COMMUNITY): Payer: Medicaid Other | Admitting: Anesthesiology

## 2013-02-28 HISTORY — PX: ORGAN PROCUREMENT: SHX5270

## 2013-02-28 LAB — POCT I-STAT 3, ART BLOOD GAS (G3+)
ACID-BASE DEFICIT: 6 mmol/L — AB (ref 0.0–2.0)
ACID-BASE DEFICIT: 3 mmol/L — AB (ref 0.0–2.0)
ACID-BASE DEFICIT: 9 mmol/L — AB (ref 0.0–2.0)
BICARBONATE: 21.4 meq/L (ref 20.0–24.0)
Bicarbonate: 25.2 mEq/L — ABNORMAL HIGH (ref 20.0–24.0)
Bicarbonate: 16.1 mEq/L — ABNORMAL LOW (ref 20.0–24.0)
O2 SAT: 95 %
O2 Saturation: 100 %
O2 Saturation: 99 %
Patient temperature: 98.5
Patient temperature: 98.2
TCO2: 23 mmol/L (ref 0–100)
TCO2: 27 mmol/L (ref 0–100)
TCO2: 17 mmol/L (ref 0–100)
pCO2 arterial: 47.5 mmHg — ABNORMAL HIGH (ref 35.0–45.0)
pCO2 arterial: 55.3 mmHg — ABNORMAL HIGH (ref 35.0–45.0)
pCO2 arterial: 31.1 mmHg — ABNORMAL LOW (ref 35.0–45.0)
pH, Arterial: 7.262 — ABNORMAL LOW (ref 7.350–7.450)
pH, Arterial: 7.267 — ABNORMAL LOW (ref 7.350–7.450)
pH, Arterial: 7.32 — ABNORMAL LOW (ref 7.350–7.450)
pO2, Arterial: 85 mmHg (ref 80.0–100.0)
pO2, Arterial: 359 mmHg — ABNORMAL HIGH (ref 80.0–100.0)
pO2, Arterial: 131 mmHg — ABNORMAL HIGH (ref 80.0–100.0)

## 2013-02-28 LAB — URINALYSIS, ROUTINE W REFLEX MICROSCOPIC
BILIRUBIN URINE: NEGATIVE
Bilirubin Urine: NEGATIVE
Glucose, UA: 100 mg/dL — AB
Glucose, UA: 250 mg/dL — AB
Hgb urine dipstick: NEGATIVE
KETONES UR: NEGATIVE mg/dL
Ketones, ur: NEGATIVE mg/dL
Leukocytes, UA: NEGATIVE
Leukocytes, UA: NEGATIVE
NITRITE: NEGATIVE
Nitrite: NEGATIVE
PH: 5 (ref 5.0–8.0)
Protein, ur: NEGATIVE mg/dL
Protein, ur: NEGATIVE mg/dL
Specific Gravity, Urine: 1.008 (ref 1.005–1.030)
Specific Gravity, Urine: 1.013 (ref 1.005–1.030)
UROBILINOGEN UA: 0.2 mg/dL (ref 0.0–1.0)
UROBILINOGEN UA: 0.2 mg/dL (ref 0.0–1.0)
pH: 5 (ref 5.0–8.0)

## 2013-02-28 LAB — GLUCOSE, CAPILLARY
Glucose-Capillary: 125 mg/dL — ABNORMAL HIGH (ref 70–99)
Glucose-Capillary: 198 mg/dL — ABNORMAL HIGH (ref 70–99)
Glucose-Capillary: 200 mg/dL — ABNORMAL HIGH (ref 70–99)
Glucose-Capillary: 231 mg/dL — ABNORMAL HIGH (ref 70–99)
Glucose-Capillary: 268 mg/dL — ABNORMAL HIGH (ref 70–99)

## 2013-02-28 LAB — BASIC METABOLIC PANEL
BUN: 14 mg/dL (ref 6–23)
CO2: 20 meq/L (ref 19–32)
Calcium: 8.7 mg/dL (ref 8.4–10.5)
Chloride: 118 mEq/L — ABNORMAL HIGH (ref 96–112)
Creatinine, Ser: 1.16 mg/dL — ABNORMAL HIGH (ref 0.50–1.10)
GFR calc Af Amer: 59 mL/min — ABNORMAL LOW (ref >90)
GFR calc non Af Amer: 51 mL/min — ABNORMAL LOW (ref >90)
GLUCOSE: 301 mg/dL — AB (ref 70–99)
Potassium: 4.4 mEq/L (ref 3.7–5.3)
Sodium: 152 mEq/L — ABNORMAL HIGH (ref 137–147)

## 2013-02-28 LAB — MAGNESIUM: MAGNESIUM: 2.4 mg/dL (ref 1.5–2.5)

## 2013-02-28 LAB — CBC
HCT: 40.8 % (ref 36.0–46.0)
HEMOGLOBIN: 14.3 g/dL (ref 12.0–15.0)
MCH: 31.6 pg (ref 26.0–34.0)
MCHC: 35 g/dL (ref 30.0–36.0)
MCV: 90.1 fL (ref 78.0–100.0)
PLATELETS: 268 10*3/uL (ref 150–400)
RBC: 4.53 MIL/uL (ref 3.87–5.11)
RDW: 13.4 % (ref 11.5–15.5)
WBC: 36.7 10*3/uL — AB (ref 4.0–10.5)

## 2013-02-28 LAB — PHOSPHORUS: PHOSPHORUS: 3.5 mg/dL (ref 2.3–4.6)

## 2013-02-28 LAB — URINE MICROSCOPIC-ADD ON

## 2013-02-28 SURGERY — SURGICAL PROCUREMENT, ORGAN
Anesthesia: General | Site: Abdomen | Laterality: Bilateral

## 2013-02-28 MED ORDER — TECHNETIUM TC 99M EXAMETAZIME IV KIT
20.0000 | PACK | Freq: Once | INTRAVENOUS | Status: AC | PRN
  Administered 2013-02-28: 20 via INTRAVENOUS

## 2013-02-28 MED ORDER — LABETALOL HCL 5 MG/ML IV SOLN
INTRAVENOUS | Status: DC | PRN
  Administered 2013-02-28: 10 mg via INTRAVENOUS

## 2013-02-28 MED ORDER — METOPROLOL TARTRATE 1 MG/ML IV SOLN
5.0000 mg | Freq: Once | INTRAVENOUS | Status: AC
  Administered 2013-02-28: 5 mg via INTRAVENOUS

## 2013-02-28 MED ORDER — INSULIN ASPART 100 UNIT/ML ~~LOC~~ SOLN: 1.0000 [IU] | SUBCUTANEOUS | Status: DC

## 2013-02-28 MED ORDER — NICARDIPINE HCL IN NACL 20-0.86 MG/200ML-% IV SOLN
3.0000 mg/h | INTRAVENOUS | Status: DC
  Administered 2013-02-28 (×2): 5 mg/h via INTRAVENOUS
  Filled 2013-02-28 (×2): qty 200

## 2013-02-28 MED ORDER — LACTATED RINGERS IV SOLN
INTRAVENOUS | Status: DC | PRN
  Administered 2013-02-28: 15:00:00 via INTRAVENOUS

## 2013-02-28 MED ORDER — DESMOPRESSIN ACETATE 4 MCG/ML IJ SOLN
0.5000 ug | Freq: Once | INTRAMUSCULAR | Status: AC
  Administered 2013-02-28: 0.52 ug via INTRAVENOUS
  Filled 2013-02-28: qty 0.13

## 2013-02-28 MED ORDER — HEPARIN SODIUM (PORCINE) 1000 UNIT/ML IJ SOLN
INTRAMUSCULAR | Status: DC | PRN
  Administered 2013-02-28: 26000 [IU] via INTRAVENOUS

## 2013-02-28 MED ORDER — LIDOCAINE HCL (CARDIAC) 20 MG/ML IV SOLN
INTRAVENOUS | Status: DC | PRN
  Administered 2013-02-28: 100 mg via INTRAVENOUS

## 2013-02-28 MED ORDER — SUCCINYLCHOLINE CHLORIDE 20 MG/ML IJ SOLN
INTRAMUSCULAR | Status: AC
  Filled 2013-02-28: qty 1

## 2013-02-28 MED ORDER — HEPARIN SODIUM (PORCINE) 1000 UNIT/ML IJ SOLN
300.0000 [IU]/kg | INTRAMUSCULAR | Status: DC
  Filled 2013-02-28: qty 26.1

## 2013-02-28 MED ORDER — VECURONIUM BROMIDE 10 MG IV SOLR
INTRAVENOUS | Status: DC | PRN
  Administered 2013-02-28: 10 mg via INTRAVENOUS

## 2013-02-28 MED ORDER — LACTATED RINGERS IV BOLUS (SEPSIS)
1000.0000 mL | Freq: Once | INTRAVENOUS | Status: AC
  Administered 2013-02-28: 1000 mL via INTRAVENOUS

## 2013-02-28 MED ORDER — EPHEDRINE SULFATE 50 MG/ML IJ SOLN
INTRAMUSCULAR | Status: DC | PRN
  Administered 2013-02-28: 10 mg via INTRAVENOUS
  Administered 2013-02-28 (×2): 20 mg via INTRAVENOUS

## 2013-02-28 MED ORDER — INSULIN ASPART 100 UNIT/ML ~~LOC~~ SOLN
0.0000 [IU] | SUBCUTANEOUS | Status: DC
  Administered 2013-02-28: 4 [IU] via SUBCUTANEOUS
  Administered 2013-02-28: 7 [IU] via SUBCUTANEOUS
  Administered 2013-02-28: 11 [IU] via SUBCUTANEOUS

## 2013-02-28 MED ORDER — METOPROLOL TARTRATE 1 MG/ML IV SOLN
INTRAVENOUS | Status: AC
  Administered 2013-02-28: 5 mg via INTRAVENOUS
  Filled 2013-02-28: qty 5

## 2013-02-28 MED ORDER — ROCURONIUM BROMIDE 50 MG/5ML IV SOLN
INTRAVENOUS | Status: AC
  Filled 2013-02-28: qty 1

## 2013-02-28 MED ORDER — EPINEPHRINE HCL 0.1 MG/ML IJ SOSY
PREFILLED_SYRINGE | INTRAMUSCULAR | Status: DC | PRN
  Administered 2013-02-28: 0.1 mg via INTRAVENOUS

## 2013-02-28 MED ORDER — PHENYLEPHRINE HCL 10 MG/ML IJ SOLN
INTRAMUSCULAR | Status: DC | PRN
Start: 2013-02-28 — End: 2013-02-28
  Administered 2013-02-28 (×2): 80 ug via INTRAVENOUS

## 2013-02-28 MED ORDER — PIPERACILLIN-TAZOBACTAM 3.375 G IVPB 30 MIN
3.3750 g | Freq: Once | INTRAVENOUS | Status: AC
  Administered 2013-02-28: 3.375 g via INTRAVENOUS
  Filled 2013-02-28: qty 50

## 2013-02-28 MED ORDER — LIDOCAINE HCL (CARDIAC) 20 MG/ML IV SOLN
INTRAVENOUS | Status: AC
  Filled 2013-02-28: qty 5

## 2013-02-28 MED ORDER — 0.9 % SODIUM CHLORIDE (POUR BTL) OPTIME
TOPICAL | Status: DC | PRN
  Administered 2013-02-28 (×6): 1000 mL

## 2013-02-28 MED ORDER — ROCURONIUM BROMIDE 100 MG/10ML IV SOLN
INTRAVENOUS | Status: DC | PRN
  Administered 2013-02-28: 50 mg via INTRAVENOUS

## 2013-02-28 MED ORDER — PHENYLEPHRINE 40 MCG/ML (10ML) SYRINGE FOR IV PUSH (FOR BLOOD PRESSURE SUPPORT)
PREFILLED_SYRINGE | INTRAVENOUS | Status: AC
  Filled 2013-02-28: qty 10

## 2013-02-28 MED ORDER — LABETALOL HCL 5 MG/ML IV SOLN
INTRAVENOUS | Status: AC
  Filled 2013-02-28: qty 4

## 2013-02-28 MED ORDER — SODIUM CHLORIDE 0.9 % IV SOLN
1000.0000 mg | Freq: Once | INTRAVENOUS | Status: AC
  Administered 2013-02-28: 1000 mg via INTRAVENOUS
  Filled 2013-02-28: qty 8

## 2013-02-28 MED ORDER — SODIUM CHLORIDE 0.45 % IV SOLN
INTRAVENOUS | Status: DC
  Administered 2013-02-28: 08:00:00 via INTRAVENOUS

## 2013-02-28 MED ORDER — SODIUM CHLORIDE 0.9 % IV SOLN
10.0000 mg | INTRAVENOUS | Status: DC | PRN
  Administered 2013-02-28: 50 ug/min via INTRAVENOUS

## 2013-02-28 MED ORDER — HEPARIN SODIUM (PORCINE) 5000 UNIT/ML IJ SOLN
300.0000 [IU]/kg | INTRAMUSCULAR | Status: DC
  Filled 2013-02-28 (×3): qty 5.22

## 2013-02-28 MED ORDER — EPINEPHRINE HCL 0.1 MG/ML IJ SOSY
PREFILLED_SYRINGE | INTRAMUSCULAR | Status: AC
  Filled 2013-02-28: qty 10

## 2013-02-28 SURGICAL SUPPLY — 76 items
BLADE STERNUM SYSTEM 6 (BLADE) ×3 IMPLANT
BLADE SURG 10 STRL SS (BLADE) ×6 IMPLANT
BLADE SURG ROTATE 9660 (MISCELLANEOUS) IMPLANT
CANNULA VESSEL W/WING WO/VALVE (CANNULA) IMPLANT
CHLORAPREP W/TINT 26ML (MISCELLANEOUS) ×6 IMPLANT
CLIP TI MEDIUM 24 (CLIP) ×3 IMPLANT
CLIP TI WIDE RED SMALL 24 (CLIP) IMPLANT
CLOTH BEACON ORANGE TIMEOUT ST (SAFETY) ×3 IMPLANT
CONT SPEC STER OR (MISCELLANEOUS) ×12 IMPLANT
CONT SPECI 4OZ STER CLIK (MISCELLANEOUS) ×15 IMPLANT
COVER MAYO STAND STRL (DRAPES) ×3 IMPLANT
COVER SURGICAL LIGHT HANDLE (MISCELLANEOUS) ×3 IMPLANT
COVER TABLE BACK 60X90 (DRAPES) ×3 IMPLANT
DRAPE PROXIMA HALF (DRAPES) IMPLANT
DRAPE SLUSH MACHINE 52X66 (DRAPES) ×3 IMPLANT
DRESSING TELFA 8X3 (GAUZE/BANDAGES/DRESSINGS) ×3 IMPLANT
DRSG COVADERM 4X10 (GAUZE/BANDAGES/DRESSINGS) ×6 IMPLANT
DRSG COVADERM 4X8 (GAUZE/BANDAGES/DRESSINGS) ×3 IMPLANT
DURAPREP 26ML APPLICATOR (WOUND CARE) IMPLANT
ELECT BLADE 6.5 EXT (BLADE) IMPLANT
ELECT REM PT RETURN 9FT ADLT (ELECTROSURGICAL) ×6
ELECTRODE REM PT RTRN 9FT ADLT (ELECTROSURGICAL) ×2 IMPLANT
GAUZE SPONGE 4X4 16PLY XRAY LF (GAUZE/BANDAGES/DRESSINGS) IMPLANT
GLOVE BIO SURGEON STRL SZ 6.5 (GLOVE) ×2 IMPLANT
GLOVE BIO SURGEON STRL SZ7.5 (GLOVE) ×9 IMPLANT
GLOVE BIO SURGEONS STRL SZ 6.5 (GLOVE) ×1
GLOVE BIOGEL PI IND STRL 6.5 (GLOVE) ×3 IMPLANT
GLOVE BIOGEL PI IND STRL 7.0 (GLOVE) ×1 IMPLANT
GLOVE BIOGEL PI IND STRL 8 (GLOVE) ×1 IMPLANT
GLOVE BIOGEL PI INDICATOR 6.5 (GLOVE) ×6
GLOVE BIOGEL PI INDICATOR 7.0 (GLOVE) ×2
GLOVE BIOGEL PI INDICATOR 8 (GLOVE) ×2
GLOVE ECLIPSE 7.0 STRL STRAW (GLOVE) ×3 IMPLANT
GOWN STRL NON-REIN LRG LVL3 (GOWN DISPOSABLE) ×18 IMPLANT
KIT POST MORTEM ADULT 36X90 (BAG) ×3 IMPLANT
KIT ROOM TURNOVER OR (KITS) ×3 IMPLANT
LOOP VESSEL MAXI BLUE (MISCELLANEOUS) ×3 IMPLANT
LOOP VESSEL MINI RED (MISCELLANEOUS) ×3 IMPLANT
MANIFOLD NEPTUNE II (INSTRUMENTS) IMPLANT
NEEDLE BIOPSY 14X6 SOFT TISS (NEEDLE) ×3 IMPLANT
NS IRRIG 1000ML POUR BTL (IV SOLUTION) IMPLANT
PACK AORTA (CUSTOM PROCEDURE TRAY) ×3 IMPLANT
PAD ARMBOARD 7.5X6 YLW CONV (MISCELLANEOUS) ×6 IMPLANT
PENCIL BUTTON HOLSTER BLD 10FT (ELECTRODE) ×3 IMPLANT
SOLUTION BETADINE 4OZ (MISCELLANEOUS) ×3 IMPLANT
SPONGE INTESTINAL PEANUT (DISPOSABLE) ×3 IMPLANT
SPONGE LAP 18X18 X RAY DECT (DISPOSABLE) IMPLANT
STAPLER VISISTAT 35W (STAPLE) ×3 IMPLANT
SUCTION POOLE TIP (SUCTIONS) ×6 IMPLANT
SUT BONE WAX W31G (SUTURE) ×3 IMPLANT
SUT ETHIBOND 5 LR DA (SUTURE) IMPLANT
SUT ETHILON 1 LR 30 (SUTURE) ×15 IMPLANT
SUT ETHILON 2 LR (SUTURE) IMPLANT
SUT PROLENE 4 0 RB 1 (SUTURE) ×6
SUT PROLENE 4-0 RB1 .5 CRCL 36 (SUTURE) ×2 IMPLANT
SUT PROLENE 6 0 BV (SUTURE) IMPLANT
SUT SILK 0 TIES 10X30 (SUTURE) ×3 IMPLANT
SUT SILK 1 SH (SUTURE) IMPLANT
SUT SILK 1 TIES 10X30 (SUTURE) IMPLANT
SUT SILK 2 0 (SUTURE)
SUT SILK 2 0 SH (SUTURE) IMPLANT
SUT SILK 2 0 SH CR/8 (SUTURE) ×3 IMPLANT
SUT SILK 2 0 TIES 10X30 (SUTURE) ×6 IMPLANT
SUT SILK 2-0 18XBRD TIE 12 (SUTURE) IMPLANT
SUT SILK 3 0 TIES 10X30 (SUTURE) IMPLANT
SWAB COLLECTION DEVICE MRSA (MISCELLANEOUS) IMPLANT
SYRINGE TOOMEY DISP (SYRINGE) ×3 IMPLANT
TAPE UMBILICAL 1/8 X36 TWILL (MISCELLANEOUS) ×6 IMPLANT
TOWEL OR 17X24 6PK STRL BLUE (TOWEL DISPOSABLE) ×3 IMPLANT
TOWEL OR 17X26 10 PK STRL BLUE (TOWEL DISPOSABLE) ×6 IMPLANT
TUBE ANAEROBIC SPECIMEN COL (MISCELLANEOUS) IMPLANT
TUBE CONNECTING 12'X1/4 (SUCTIONS) ×1
TUBE CONNECTING 12X1/4 (SUCTIONS) ×2 IMPLANT
TUBING IRRIGATION (TUBING) ×3 IMPLANT
WATER STERILE IRR 1000ML POUR (IV SOLUTION) IMPLANT
YANKAUER SUCT BULB TIP NO VENT (SUCTIONS) ×9 IMPLANT

## 2013-02-28 NOTE — Transfer of Care (Signed)
Immediate Anesthesia Transfer of Care Note  Patient: Emma Pearson  Procedure(s) Performed: Procedure(s): ORGAN PROCUREMENT for Kidneys , liver, pancreas (Bilateral)  Patient Location: PACU and Nursing Unit  Anesthesia Type:General  Level of Consciousness: comatose and Patient remains intubated per anesthesia plan  Airway & Oxygen Therapy: Patient neurologically dead.  Organs harvested.  all life support discontinued.  Post-op Assessment: All life support withdrawn  Post vital signs: Reviewed  Complications: None

## 2013-02-28 NOTE — Progress Notes (Signed)
O2 Challenge per [facility].  Placed patient on Fio2 100%.

## 2013-02-28 NOTE — Progress Notes (Signed)
ABG done per [facility]. 

## 2013-02-28 NOTE — Progress Notes (Signed)
Recruitment done per [facility]. Patient placed back on full support.

## 2013-02-28 NOTE — Progress Notes (Signed)
ABG done per [facility].

## 2013-02-28 NOTE — Anesthesia Preprocedure Evaluation (Signed)
Anesthesia Evaluation  Patient identified by MRN, date of birth, ID band Patient unresponsive    Reviewed: Allergy & Precautions, H&P , NPO status , Patient's Chart, lab work & pertinent test results  History of Anesthesia Complications Negative for: history of anesthetic complications  Airway       Dental   Pulmonary Current Smoker,    Pulmonary exam normal       Cardiovascular hypertension, Pt. on medications Rhythm:Regular Rate:Normal     Neuro/Psych Patient deemed neurologically dead.  For organ procurement CVA, Residual Symptoms    GI/Hepatic negative GI ROS, Neg liver ROS,   Endo/Other  negative endocrine ROS  Renal/GU negative Renal ROS     Musculoskeletal   Abdominal   Peds  Hematology negative hematology ROS (+)   Anesthesia Other Findings   Reproductive/Obstetrics                           Anesthesia Physical Anesthesia Plan  ASA: VI  Anesthesia Plan: General   Post-op Pain Management:    Induction: Inhalational  Airway Management Planned: Oral ETT  Additional Equipment:   Intra-op Plan:   Post-operative Plan:   Informed Consent:   Plan Discussed with: CRNA, Anesthesiologist and Surgeon  Anesthesia Plan Comments:         Anesthesia Quick Evaluation

## 2013-02-28 NOTE — Progress Notes (Signed)
ABG done per [facility]. 

## 2013-02-28 NOTE — Progress Notes (Signed)
Alerted by RN that spontaneous movements of upper extremities noted resembling posturing.  Patient reassessed: no gag, no cough, no pupillary reflexes, no spontaneous respiratory efforts noted.  Nuclear brain perfusion study ordered.

## 2013-03-01 LAB — CULTURE, RESPIRATORY

## 2013-03-01 LAB — CULTURE, RESPIRATORY W GRAM STAIN

## 2013-03-01 NOTE — Anesthesia Postprocedure Evaluation (Signed)
  Anesthesia Post-op Note  Patient: Emma Pearson  Procedure(s) Performed: Procedure(s): ORGAN PROCUREMENT for Kidneys , liver, pancreas (Bilateral)  Patient Location: organ procurement. Patient expired.  Anesthesia Type General  Level of Consciousness: patient expired  Airway and Oxygen Therapy: patient expired  Post-op Pain:  Patient expired  Post-op Assessment:patient expired  Post-op Vital Signs: patient expired  Complications: none

## 2013-03-02 ENCOUNTER — Encounter (HOSPITAL_COMMUNITY): Payer: Self-pay

## 2013-03-02 LAB — CULTURE, RESPIRATORY

## 2013-03-02 LAB — TYPE AND SCREEN
ABO/RH(D): O POS
ANTIBODY SCREEN: NEGATIVE
UNIT DIVISION: 0
UNIT DIVISION: 0
Unit division: 0
Unit division: 0

## 2013-03-02 LAB — CULTURE, RESPIRATORY W GRAM STAIN

## 2013-03-05 LAB — CULTURE, BLOOD (ROUTINE X 2)
CULTURE: NO GROWTH
CULTURE: NO GROWTH

## 2013-03-05 NOTE — Progress Notes (Signed)
Profound neurological injury GCS 3 Brainstem reflexes absent ( no pupillary, corneal reflexes, cough or gag ) Off sedation, without electrolyte abnormalities or hypothermia Apnea test positive - no spontaneous respiratory efforts after CO2 increase 31 --> 67  Temp:  [97.5 F (36.4 C)-99 F (37.2 C)] 98.2 F (36.8 C) (01/26 1111) Pulse Rate:  [63-132] 63 (01/26 1111) Resp:  [13-32] 16 (01/26 1111) BP: (59-225)/(31-114) 91/56 mmHg (01/26 0600) SpO2:  [97 %-100 %] 100 % (01/26 1111) Arterial Line BP: (102-166)/(62-108) 105/62 mmHg (01/26 0600) FiO2 (%):  [40 %] 40 % (01/26 1111) Weight:  [83.915 kg (185 lb)-86.8 kg (191 lb 5.8 oz)] 86.8 kg (191 lb 5.8 oz) (01/26 0356)   Recent Labs Lab May 15, 2013 1240 May 15, 2013 2007  NA 137 141  K 4.6 3.7  CL 97 100  CO2 21 26  GLUCOSE 209* 185*  BUN 10 10  CREATININE 0.87 0.97  CALCIUM 8.6 8.9    Recent Labs Lab May 15, 2013 1300 02/26/2013 1112 02/26/2013 1131 02/24/2013 1144  PHART 7.271* 7.554* 7.375 7.293*  PCO2ART 53.0* 30.8* 53.1* 66.5*  PO2ART 95.0 127.0* 316.0* 216.0*  HCO3 24.4* 27.2* 31.1* 32.3*  TCO2 26 28 33 34  O2SAT 96.0 99.0 100.0 100.0   Meets the criteria for brain death.  Pronounced dead 02/09/2013 11:50.  Lonia FarberZUBELEVITSKIY, Djuana Littleton, MD Pulmonary and Critical Care Medicine [facility] Pager: [phone]

## 2013-03-05 NOTE — Progress Notes (Signed)
02/15/2013 1831 recruitment done due to [facility]. Placed patient back on full support. Rt will continue to monitor.

## 2013-03-05 NOTE — Procedures (Signed)
Central Venous Catheter Insertion Procedure Note Artis FlockBetsi Veley [id number] 1955/11/17  Procedure: Insertion of Central Venous Catheter Indications: Assessment of intravascular volume, Drug and/or fluid administration and Frequent blood sampling  Procedure Details Consent: Risks of procedure as well as the alternatives and risks of each were explained to the (patient/caregiver).  Consent for procedure obtained. and Unable to obtain consent because of altered level of consciousness. Time Out: Verified patient identification, verified procedure, site/side was marked, verified correct patient position, special equipment/implants available, medications/allergies/relevent history reviewed, required imaging and test results available.  Performed  Maximum sterile technique was used including antiseptics, cap, gloves, gown, hand hygiene, mask and sheet. Skin prep: Chlorhexidine; local anesthetic administered A antimicrobial bonded/coated triple lumen catheter was placed in the right internal jugular vein using the Seldinger technique.  Evaluation Blood flow good Complications: No apparent complications Patient did tolerate procedure well. Chest X-ray ordered to verify placement.  CXR: pending.  BABCOCK,PETE 02/07/2013, 3:16 PM  Supervised and present through the entire procedure.  Lonia FarberZUBELEVITSKIY, Neziah Vogelgesang, MD Pulmonary and Critical Care Medicine [facility] Pager: [phone]

## 2013-03-05 NOTE — Progress Notes (Signed)
ABG done per [place] [facility]. 

## 2013-03-05 NOTE — H&P (Signed)
PULMONARY / CRITICAL CARE MEDICINE  Name: Artis FlockBetsi Antonini MRN: [id number] DOB: Apr 13, 1955    ADMISSION DATE:  12/21/13 CONSULTATION DATE:  12/21/13  REFERRING MD : EDP PRIMARY SERVICE: PCCM  CHIEF COMPLAINT:  Acute encephalopathy  BRIEF PATIENT DESCRIPTION:  58 yo admitted with large brainstem hemorrhage.  SIGNIFICANT EVENTS / STUDIES:  1/25  Head CT >>> Large intracranial hemorrhage with hydrocephalus and midline shift  LINES / TUBES: OETT  1/25 >>>  CULTURES:  ANTIBIOTICS:  SUBJECTIVE: Undergoing evaluation by CDS  VITAL SIGNS: Temp:  [97.5 F (36.4 C)-99 F (37.2 C)] 97.7 F (36.5 C) (01/26 0600) Pulse Rate:  [64-132] 64 (01/26 0600) Resp:  [13-32] 16 (01/26 0600) BP: (59-225)/(31-114) 91/56 mmHg (01/26 0600) SpO2:  [97 %-100 %] 100 % (01/26 0600) Arterial Line BP: (102-166)/(62-108) 105/62 mmHg (01/26 0600) FiO2 (%):  [40 %] 40 % (01/26 0801) Weight:  [83.915 kg (185 lb)-86.8 kg (191 lb 5.8 oz)] 86.8 kg (191 lb 5.8 oz) (01/26 0356)  HEMODYNAMICS:   VENTILATOR SETTINGS: Vent Mode:  [-] PRVC FiO2 (%):  [40 %] 40 % Set Rate:  [16 bmp] 16 bmp Vt Set:  [500 mL] 500 mL PEEP:  [5 cmH20] 5 cmH20 Plateau Pressure:  [17 cmH20-20 cmH20] 19 cmH20  INTAKE / OUTPUT: Intake/Output     01/25 0701 - 01/26 0700 01/26 0701 - 01/27 0700   I.V. (mL/kg) 1697 (19.6)    Total Intake(mL/kg) 1697 (19.6)    Urine (mL/kg/hr) 2500    Emesis/NG output 350    Other 250    Total Output 3100     Net -1403.1          Emesis Occurrence 1 x      PHYSICAL EXAMINATION: General:  No distress Neuro:  GCS 3, no cough / gag HEENT:  Pupils unresponsive to light Cardiovascular:  Regular, no murmurs Lungs: Bilateral air entry, few rhonchi Abdomen: Soft, bowel sounds diminished Musculoskeletal:  No edema Skin: No rash  CBC  Recent Labs Lab 2014-01-08 1240  WBC 20.8*  HGB 16.1*  HCT 46.2*  PLT 325   Coag's  Recent Labs Lab 2014-01-08 1240  APTT 26  INR 1.00    BMET  Recent Labs Lab 2014-01-08 1240 2014-01-08 2007  NA 137 141  K 4.6 3.7  CL 97 100  CO2 21 26  BUN 10 10  CREATININE 0.87 0.97  GLUCOSE 209* 185*   Electrolytes  Recent Labs Lab 2014-01-08 1240 2014-01-08 2007  CALCIUM 8.6 8.9   Sepsis Markers  Recent Labs Lab 2014-01-08 1309  LATICACIDVEN 5.78*   ABG  Recent Labs Lab 2014-01-08 1300  PHART 7.271*  PCO2ART 53.0*  PO2ART 95.0   Liver Enzymes  Recent Labs Lab 2014-01-08 1240  AST 25  ALT 13  ALKPHOS 95  BILITOT <0.2*  ALBUMIN 3.5   Cardiac Enzymes  Recent Labs Lab 2014-01-08 1240  PROBNP 445.5*   Glucose  Recent Labs Lab 2014-01-08 2008 2014-01-08 2344 02/10/2013 0346  GLUCAP 164* 146* 137*   CXR:  None today  ASSESSMENT / PLAN:  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  CRITICAL CARE:  The patient  is critically ill with multiple organ systems failure and requires high complexity decision making for assessment and support, frequent evaluation and titration of therapies, application of advanced monitoring technologies and extensive interpretation of multiple databases. Critical Care Time devoted to patient care services described in this note is 35 minutes.   [name], MD Pulmonary and Critical Care Medicine [facility] Pager: [phone]  03/03/13, 10:18 AM

## 2013-03-05 NOTE — Progress Notes (Signed)
02/23/2013 Apnea test was done with RT and Heather RN . ABG test was done at baseline. Placed an oxygen tube down the ETT to the cervina for 8 min of 8L [place]. Rather a ABG. Then repeated apnea test for 12 mins as above. Rather another ABG test. Then place patient back on full support.

## 2013-03-05 NOTE — Progress Notes (Signed)
UR completed.  Keniyah Gelinas, RN BSN [facility] CCM Trauma/Neuro ICU Case Manager [phone]  

## 2013-03-05 NOTE — Progress Notes (Signed)
02/05/2013 1515 broch done with Cindee LamePete PA ,Idelle Leechonnie RN, Nidal Rivet RT. 20 ml placed down ETT 15ml draw back for sample to send down to lab. Rt will continue to monitor.

## 2013-03-05 NOTE — Clinical Documentation Improvement (Signed)
  Abnormal findings (laboratory, x-ray, MRI/CT scans) are not coded and reported unless the physician indicates their clinical significance. The medical record reflects the following clinical findings. If possible, please help by clarifying the diagnostic and/or clinical significance of these abnormal findings. Thank you!  CT Head 11/24/2013 FINDINGS: Extensive intracranial hemorrhage is present. There is extensive parenchymal hemorrhage within the brainstem and bilateral basal ganglia. There is extension into the ventricular system. Ventricles are enlarged worrisome for developing acute hydrocephalus. There is tonsillar herniation at the foramen magnum. Basilar cisterns are effaced. 4 mm of midline shift the left.  Possible Clinical Conditions?  - Tonsillar Herniation  - Other condition (please document in the progress notes and/or discharge summary)   Thank You, Saul FordyceSalena A Khalin Royce ,RN Clinical Documentation Specialist:  [phone]  [facility]- Health Information Management

## 2013-03-05 NOTE — Progress Notes (Signed)
01-May-2013 1626 [place] [facility] ask for the patient to be placed on a 100% then need a ABG in 15 mins.

## 2013-03-05 NOTE — Progress Notes (Signed)
Lung recruitment done per [place] [facility].  Placed back on full support. Rt will continue to monitor.

## 2013-03-05 NOTE — Progress Notes (Signed)
  Echocardiogram 2D Echocardiogram has been performed.  Georgian CoWILLIAMS, Jancarlos Thrun Jun 16, 2013, 5:54 PM

## 2013-03-05 NOTE — Procedures (Signed)
Bronchoscopy Procedure Note Artis FlockBetsi Sidney [id number] 1955-07-08  Procedure: Bronchoscopy Indications: Diagnostic evaluation of the airways  Procedure Details Consent: Risks of procedure as well as the alternatives and risks of each were explained to the (patient/caregiver).  Consent for procedure obtained. Time Out: Verified patient identification, verified procedure, site/side was marked, verified correct patient position, special equipment/implants available, medications/allergies/relevent history reviewed, required imaging and test results available.  Performed  In preparation for procedure, patient was given 100% FiO2. Sedation: not indicated   Airway entered and the following bronchi were examined: RUL, RML, RLL, LUL, LLL and Bronchi.  Airways as follows: RUL: purulent secretions (yellow tinged). Sample sent. RLL purulent secretions easily suctioned. Minimal. LLL minimal secretions. Did notice scant irritation and bleeding from what looked like suction trauma in LUL.  Procedures performed: not needed.  Bronchoscope removed.    Evaluation Hemodynamic Status: BP stable throughout; O2 sats: stable throughout Patient's Current Condition: stable Specimens:  Sent purulent fluid from the RUL and LLL Complications: No apparent complications Patient did tolerate procedure well.   BABCOCK,PETE 02/12/2013  Supervised and present through the entire procedure.  Lonia FarberZUBELEVITSKIY, Divit Stipp, MD Pulmonary and Critical Care Medicine [facility] Pager: [phone]

## 2013-03-05 NOTE — Progress Notes (Signed)
O2 Challenge Performed.

## 2013-03-05 DEATH — deceased

## 2013-03-14 NOTE — Discharge Summary (Signed)
Name: Emma FlockBetsi Pearson MRN: [id number] DOB: 10/20/1955  PCCM DEATH NOTE  Time of death:  02/24/2013  Cause of death: Acute cerebral edema  Discharge diagnoses: Acute cerebral edema Brainstem hemorrhage  Brief hospital course:  58 yo admitted 1/25 with brainstem hemorrhage and associated cerebral edema.  Progressed to brain death which was established by clinical examination / apnea test and confirmed by cerebral perfusion study.  Lonia FarberZUBELEVITSKIY, Salahuddin Arismendez, MD Pulmonary and Critical Care Medicine [facility] Pager: [phone]) [phone]

## 2014-08-22 IMAGING — NM NM BRAIN 4+V W/ FLOW
1 series · 6 of 6 positions shown · non-contrast
Comparison: CT HEAD W/O CM dated 02/26/2013;

CLINICAL DATA: Intracranial hemorrhage. Evaluate for cerebral
fusion.

EXAM:
NM BRAIN SCAN WITH FLOW - 4+ VIEW
TECHNIQUE: Radionuclide angiogram and static images of the brain were obtained
after intravenous injection of radiopharmaceutical. Vascular flow
images are obtained as well as multi projection planar imaging.

[bd brain flow · 3.43mm/px · 6 of 60 frames shown]
[frame 6/60]
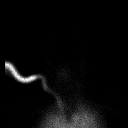
[frame 16/60]
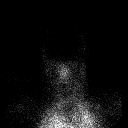
[frame 26/60]
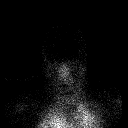
[frame 36/60]
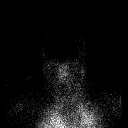
[frame 46/60]
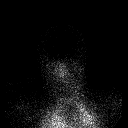
[frame 56/60]
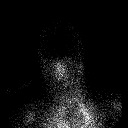

[6 of 6 positions shown; findings below may reference images not displayed]

CT ANGIO CHEST W/CM
&/OR WO/CM dated 02/26/2013

RADIOPHARMACEUTICALS:  20 mCi Ceretec
FINDINGS: Normal median vascular flow within the chest and neck consistent
with proper radiotracer administration. On the delayed planar
imaging, there is no accumulation of radiotracer within the cerebral
hemispheres consistent with no cerebral profusion.
IMPRESSION: No cerebral profusion present.

Findings conveyed Mardy Koshy, RN  on 02/28/2013  at[DATE].
